# Patient Record
Sex: Female | Born: 1995 | Hispanic: Yes | Marital: Single | Smoking: Never smoker
Health system: Southern US, Community
[De-identification: ages and names within clinical notes are randomized; demographics above are authoritative.]

## PROBLEM LIST (undated history)

## (undated) DIAGNOSIS — N946 Dysmenorrhea, unspecified: Secondary | ICD-10-CM

## (undated) HISTORY — PX: WISDOM TOOTH EXTRACTION: SHX21

## (undated) HISTORY — DX: Dysmenorrhea, unspecified: N94.6

## (undated) HISTORY — PX: ABDOMINAL SURGERY: SHX537

---

## 2016-08-31 ENCOUNTER — Ambulatory Visit (INDEPENDENT_AMBULATORY_CARE_PROVIDER_SITE_OTHER): Payer: Medicaid Other | Admitting: Obstetrics and Gynecology

## 2016-08-31 ENCOUNTER — Encounter: Payer: Self-pay | Admitting: Obstetrics and Gynecology

## 2016-08-31 VITALS — BP 99/60 | HR 75 | Ht 60.0 in | Wt 116.3 lb

## 2016-08-31 DIAGNOSIS — Z113 Encounter for screening for infections with a predominantly sexual mode of transmission: Secondary | ICD-10-CM

## 2016-08-31 DIAGNOSIS — N939 Abnormal uterine and vaginal bleeding, unspecified: Secondary | ICD-10-CM

## 2016-08-31 DIAGNOSIS — N946 Dysmenorrhea, unspecified: Secondary | ICD-10-CM | POA: Diagnosis not present

## 2016-08-31 LAB — POCT URINE PREGNANCY: PREG TEST UR: NEGATIVE

## 2016-08-31 MED ORDER — NORETHIN ACE-ETH ESTRAD-FE 1-20 MG-MCG PO TABS: 1.0000 | ORAL_TABLET | Freq: Every day | ORAL | 6 refills | Status: DC

## 2016-08-31 NOTE — Patient Instructions (Signed)
1. Urinalysis for STD screening is obtained 2. Begin oral contraceptives 3. Maintain menstrual calendar monitoring 4. Return in 3 months for follow-up  Oral Contraception Information Oral contraceptive pills (OCPs) are medicines taken to prevent pregnancy. OCPs work by preventing the ovaries from releasing eggs. The hormones in OCPs also cause the cervical mucus to thicken, preventing the sperm from entering the uterus. The hormones also cause the uterine lining to become thin, not allowing a fertilized egg to attach to the inside of the uterus. OCPs are highly effective when taken exactly as prescribed. However, OCPs do not prevent sexually transmitted diseases (STDs). Safe sex practices, such as using condoms along with the pill, can help prevent STDs.  Before taking the pill, you may have a physical exam and Pap test. Your health care provider may order blood tests. The health care provider will make sure you are a good candidate for oral contraception. Discuss with your health care provider the possible side effects of the OCP you may be prescribed. When starting an OCP, it can take 2 to 3 months for the body to adjust to the changes in hormone levels in your body.  TYPES OF ORAL CONTRACEPTION  The combination pill--This pill contains estrogen and progestin (synthetic progesterone) hormones. The combination pill comes in 21-day, 28-day, or 91-day packs. Some types of combination pills are meant to be taken continuously (365-day pills). With 21-day packs, you do not take pills for 7 days after the last pill. With 28-day packs, the pill is taken every day. The last 7 pills are without hormones. Certain types of pills have more than 21 hormone-containing pills. With 91-day packs, the first 84 pills contain both hormones, and the last 7 pills contain no hormones or contain estrogen only.  The minipill--This pill contains the progesterone hormone only. The pill is taken every day continuously. It is very  important to take the pill at the same time each day. The minipill comes in packs of 28 pills. All 28 pills contain the hormone.  ADVANTAGES OF ORAL CONTRACEPTIVE PILLS  Decreases premenstrual symptoms.   Treats menstrual period cramps.   Regulates the menstrual cycle.   Decreases a heavy menstrual flow.   May treatacne, depending on the type of pill.   Treats abnormal uterine bleeding.   Treats polycystic ovarian syndrome.   Treats endometriosis.   Can be used as emergency contraception.  THINGS THAT CAN MAKE ORAL CONTRACEPTIVE PILLS LESS EFFECTIVE OCPs can be less effective if:   You forget to take the pill at the same time every day.   You have a stomach or intestinal disease that lessens the absorption of the pill.   You take OCPs with other medicines that make OCPs less effective, such as antibiotics, certain HIV medicines, and some seizure medicines.   You take expired OCPs.   You forget to restart the pill on day 7, when using the packs of 21 pills.  RISKS ASSOCIATED WITH ORAL CONTRACEPTIVE PILLS  Oral contraceptive pills can sometimes cause side effects, such as:  Headache.  Nausea.  Breast tenderness.  Irregular bleeding or spotting. Combination pills are also associated with a small increased risk of:  Blood clots.  Heart attack.  Stroke.   This information is not intended to replace advice given to you by your health care provider. Make sure you discuss any questions you have with your health care provider.   Document Released: 01/06/2003 Document Revised: 08/06/2013 Document Reviewed: 04/06/2013 Elsevier Interactive Patient Education 2016 Elsevier  Inc. Dysmenorrhea Menstrual cramps (dysmenorrhea) are caused by the muscles of the uterus tightening (contracting) during a menstrual period. For some women, this discomfort is merely bothersome. For others, dysmenorrhea can be severe enough to interfere with everyday activities for a few  days each month. Primary dysmenorrhea is menstrual cramps that last a couple of days when you start having menstrual periods or soon after. This often begins after a teenager starts having her period. As a woman gets older or has a baby, the cramps will usually lessen or disappear. Secondary dysmenorrhea begins later in life, lasts longer, and the pain may be stronger than primary dysmenorrhea. The pain may start before the period and last a few days after the period.  CAUSES  Dysmenorrhea is usually caused by an underlying problem, such as:  The tissue lining the uterus grows outside of the uterus in other areas of the body (endometriosis).  The endometrial tissue, which normally lines the uterus, is found in or grows into the muscular walls of the uterus (adenomyosis).  The pelvic blood vessels are engorged with blood just before the menstrual period (pelvic congestive syndrome).  Overgrowth of cells (polyps) in the lining of the uterus or cervix.  Falling down of the uterus (prolapse) because of loose or stretched ligaments.  Depression.  Bladder problems, infection, or inflammation.  Problems with the intestine, a tumor, or irritable bowel syndrome.  Cancer of the female organs or bladder.  A severely tipped uterus.  A very tight opening or closed cervix.  Noncancerous tumors of the uterus (fibroids).  Pelvic inflammatory disease (PID).  Pelvic scarring (adhesions) from a previous surgery.  Ovarian cyst.  An intrauterine device (IUD) used for birth control. RISK FACTORS You may be at greater risk of dysmenorrhea if:  You are younger than age 19.  You started puberty early.  You have irregular or heavy bleeding.  You have never given birth.  You have a family history of this problem.  You are a smoker. SIGNS AND SYMPTOMS   Cramping or throbbing pain in your lower abdomen.  Headaches.  Lower back pain.  Nausea or vomiting.  Diarrhea.  Sweating or  dizziness.  Loose stools. DIAGNOSIS  A diagnosis is based on your history, symptoms, physical exam, diagnostic tests, or procedures. Diagnostic tests or procedures may include:  Blood tests.  Ultrasonography.  An examination of the lining of the uterus (dilation and curettage, D&C).  An examination inside your abdomen or pelvis with a scope (laparoscopy).  X-rays.  CT scan.  MRI.  An examination inside the bladder with a scope (cystoscopy).  An examination inside the intestine or stomach with a scope (colonoscopy, gastroscopy). TREATMENT  Treatment depends on the cause of the dysmenorrhea. Treatment may include:  Pain medicine prescribed by your health care provider.  Birth control pills or an IUD with progesterone hormone in it.  Hormone replacement therapy.  Nonsteroidal anti-inflammatory drugs (NSAIDs). These may help stop the production of prostaglandins.  Surgery to remove adhesions, endometriosis, ovarian cyst, or fibroids.  Removal of the uterus (hysterectomy).  Progesterone shots to stop the menstrual period.  Cutting the nerves on the sacrum that go to the female organs (presacral neurectomy).  Electric current to the sacral nerves (sacral nerve stimulation).  Antidepressant medicine.  Psychiatric therapy, counseling, or group therapy.  Exercise and physical therapy.  Meditation and yoga therapy.  Acupuncture. HOME CARE INSTRUCTIONS   Only take over-the-counter or prescription medicines as directed by your health care provider.  Place a  heating pad or hot water bottle on your lower back or abdomen. Do not sleep with the heating pad.  Use aerobic exercises, walking, swimming, biking, and other exercises to help lessen the cramping.  Massage to the lower back or abdomen may help.  Stop smoking.  Avoid alcohol and caffeine. SEEK MEDICAL CARE IF:   Your pain does not get better with medicine.  You have pain with sexual intercourse.  Your  pain increases and is not controlled with medicines.  You have abnormal vaginal bleeding with your period.  You develop nausea or vomiting with your period that is not controlled with medicine. SEEK IMMEDIATE MEDICAL CARE IF:  You pass out.    This information is not intended to replace advice given to you by your health care provider. Make sure you discuss any questions you have with your health care provider.   Document Released: 10/16/2005 Document Revised: 06/18/2013 Document Reviewed: 04/03/2013 Elsevier Interactive Patient Education [employer]

## 2016-08-31 NOTE — Progress Notes (Signed)
GYN ENCOUNTER NOTE  Subjective:       Kaitlin Kennedy is a 20 y.o. G0P0000 female is here for gynecologic evaluation of the following issues:  1. Dysmenorrhea- Patient has irregular menstrual cycles and pelvic/back cramps with menstruation. Cycles last 5-6days occurring every 28-56 days. LMP was 07/15/16.  Menarche at age 89. Pt currently uses tylenol or advil for pain relief. Sexually active once in July,  used a condom for birth control. Denies vaginal discharge, odor, itching or irritation.     Gynecologic History Patient's last menstrual period was 07/05/2016 (exact date). Contraception: condoms   Obstetric History OB History  Gravida Para Term Preterm AB Living  0 0 0 0 0 0  SAB TAB Ectopic Multiple Live Births  0 0 0 0 0        Past Medical History:  Diagnosis Date  . Dysmenorrhea     Past Surgical History:  Procedure Laterality Date  . ABDOMINAL SURGERY     ?hernia, pt was 4y0  . WISDOM TOOTH EXTRACTION      No current outpatient prescriptions on file prior to visit.   No current facility-administered medications on file prior to visit.     No Known Allergies  Social History   Social History  . Marital status: Single    Spouse name: N/A  . Number of children: N/A  . Years of education: N/A   Occupational History  . Not on file.   Social History Main Topics  . Smoking status: Never Smoker  . Smokeless tobacco: Never Used  . Alcohol use No  . Drug use: No  . Sexual activity: Yes    Partners: Male    Birth control/ protection: Condom   Other Topics Concern  . Not on file   Social History Narrative  . No narrative on file    Family History  Problem Relation Age of Onset  . Cancer Maternal Grandmother   . Diabetes Paternal Grandmother     The following portions of the patient's history were reviewed and updated as appropriate: allergies, current medications, past family history, past medical history, past social history, past surgical history and  problem list.  Review of Systems Review of Systems - General ROS: negative for - chills, fatigue, fever, hot flashes, malaise or night sweats Hematological and Lymphatic ROS: negative for - bleeding problems or swollen lymph nodes Gastrointestinal ROS: negative for -  blood in stools, change in bowel habits and nausea/vomiting Musculoskeletal ROS: negative for - joint pain, muscle pain or muscular weakness Genito-Urinary ROS: negative for - dyspareunia, dysuria, genital discharge, genital ulcers, hematuria, incontinence,  nocturia or pelvic pain  Objective:   BP 99/60   Pulse 75   Ht 5' (1.524 m)   Wt 116 lb 4.8 oz (52.8 kg)   LMP 07/05/2016 (Exact Date)   BMI 22.71 kg/m   Physical Exam: deferred    Assessment:   1. Dysmenorrhea 2. Irregular menstrual cycles 3. Sexually active   Patient has irregular periods with dysmenorrhea, managed with Advil or Tylenol. Presentation does not appear severe and no known family history of endometriosis. Since patient is also sexually active birth control pills is best option to regulate her cycles and provide contraception. Will screen for GC/CT and pregnancy.   Plan:   1. Urine GC/CT screen 2. POCT urine pregnancy test 3. Educated on importance of using condoms for protection against STDs regardless of OCPs  4. Prescribed Junel Fe 1/20 PO daily 5. Maintain menstrual calendar monitoring  A total of 30 minutes were spent face-to-face with the patient during the encounter with greater than 50% dealing with counseling and coordination of care.   RTC in 3 months for follow-up  Corena HerterAnna Parr, PA-S Herold HarmsMartin A Kathren Scearce, MD   I have seen, interviewed, and examined the patient in conjunction with the [employer] [facility] and affirm the diagnosis and management plan. Josey Forcier A. Izac Faulkenberry, MD, FACOG   Note: This dictation was prepared with Dragon dictation along with smaller phrase technology. Any transcriptional errors that result from  this process are unintentional.

## 2016-09-05 LAB — GC/CHLAMYDIA PROBE AMP
CHLAMYDIA, DNA PROBE: NEGATIVE
NEISSERIA GONORRHOEAE BY PCR: NEGATIVE

## 2016-11-30 ENCOUNTER — Ambulatory Visit (INDEPENDENT_AMBULATORY_CARE_PROVIDER_SITE_OTHER): Payer: Medicaid Other | Admitting: Obstetrics and Gynecology

## 2016-11-30 ENCOUNTER — Encounter: Payer: Self-pay | Admitting: Obstetrics and Gynecology

## 2016-11-30 VITALS — BP 91/59 | HR 76 | Ht 60.0 in | Wt 119.3 lb

## 2016-11-30 DIAGNOSIS — N946 Dysmenorrhea, unspecified: Secondary | ICD-10-CM

## 2016-11-30 DIAGNOSIS — Z3041 Encounter for surveillance of contraceptive pills: Secondary | ICD-10-CM

## 2016-11-30 DIAGNOSIS — N939 Abnormal uterine and vaginal bleeding, unspecified: Secondary | ICD-10-CM

## 2016-11-30 NOTE — Progress Notes (Signed)
GYN  ENCOUNTER NOTE  Subjective:       Kaitlin Kennedy is a 21 y.o. G0P0000 female here for a routine annual gynecologic exam.  Current complaints: 1. Irregular cycle f/u- prescribed Junel Fe 1/20 PO daily.   2. Dysmenorrhea sx    Pt reports improving cramping and back pain with Junel for November 2017. Did not refill Junel in December d/t insurance problem. Refilled in January 2018 -no cramping or pain. No need for NSAID. Occasional but improving breast tenderness. Denies Junel SE other than minimal weight gain. Some spotting likely d/t break in pills during month of December as pt. Reports otherwise takes pill at same time each day. Pt. Did not maintain a menstrual calendar but plans to do so in upcoming months.  Uncertain of Guardasil vaccination status - will check with pediatrician. Pt. sexually active with 1 partner and using condoms. STD test on 08/31/16 negative.   Gynecologic History Patient's last menstrual period was 11/22/2015 (exact date). Contraception: condoms & OCP  Obstetric History OB History  Gravida Para Term Preterm AB Living  0 0 0 0 0 0  SAB TAB Ectopic Multiple Live Births  0 0 0 0 0        Past Medical History:  Diagnosis Date  . Dysmenorrhea     Past Surgical History:  Procedure Laterality Date  . ABDOMINAL SURGERY     ?hernia, pt was 4y0  . WISDOM TOOTH EXTRACTION      Current Outpatient Prescriptions on File Prior to Visit  Medication Sig Dispense Refill  . norethindrone-ethinyl estradiol (JUNEL FE 1/20) 1-20 MG-MCG tablet Take 1 tablet by mouth daily. 1 Package 6   No current facility-administered medications on file prior to visit.     No Known Allergies  Social History   Social History  . Marital status: Single    Spouse name: N/A  . Number of children: N/A  . Years of education: N/A   Occupational History  . Not on file.   Social History Main Topics  . Smoking status: Never Smoker  . Smokeless tobacco: Never Used  . Alcohol use  No  . Drug use: No  . Sexual activity: Yes    Partners: Male    Birth control/ protection: Condom, Pill   Other Topics Concern  . Not on file   Social History Narrative  . No narrative on file    Family History  Problem Relation Age of Onset  . Cancer Maternal Grandmother   . Diabetes Paternal Grandmother     The following portions of the patient's history were reviewed and updated as appropriate: allergies, current medications, past family history, past medical history, past social history, past surgical history and problem list.  Review of Systems ROS Negative other than those listed in HPI.   Objective:   BP (!) 91/59   Pulse 76   Ht 5' (1.524 m)   Wt 119 lb 4.8 oz (54.1 kg)   LMP 11/22/2015 (Exact Date)   BMI 23.30 kg/m  CONSTITUTIONAL: Well-developed, well-nourished female in no acute distress.  PSYCHIATRIC: Normal mood and affect. Normal behavior. Normal judgment and thought content. NEUROLGIC: Alert and oriented to person, place, and time. Normal muscle tone coordination. No cranial nerve deficit noted. HENT:  Normocephalic, atraumatic, External right and left ear normal. Oropharynx is clear and moist EYES: Conjunctivae and EOM are normal. Pupils are equal, round, and reactive to light. No scleral icterus.  NECK: Normal range of motion, supple, no masses.  Normal  thyroid.  SKIN: Skin is warm and dry. No rash noted. Not diaphoretic. No erythema. No pallor. CARDIOVASCULAR: Normal heart rate noted, regular rhythm, no murmur. RESPIRATORY: Clear to auscultation bilaterally. Effort and breath sounds normal, no problems with respiration noted. BREASTS: Not examined. ABDOMEN: Soft, normal bowel sounds, no distention noted.  No tenderness, rebound or guarding.  BLADDER: Normal PELVIC: Not examined   External Genitalia: Not examined   BUS: Not examined   Vagina: Not examined   Cervix: Not examined   Uterus: Not examined   Adnexa: Not examined   RV: Not examined   MUSCULOSKELETAL: Normal range of motion. No tenderness.  No cyanosis, clubbing, or edema.  2+ distal pulses. LYMPHATIC: No Axillary, Supraclavicular, or Inguinal Adenopathy.    Assessment:  1. Irregular cycle - Undetermined improvement d/t need for 3 consecutive months of Junel & also did not document / record cycle patterns to present at this visit. 2. Dysmenorrhea sx  - improving with Junel. 3. Contraception:Junel 4.  Normal BMI Problem List Items Addressed This Visit    None      Plan:  1. Return to clinic in 3 months for follow up then 1 year for annual exam. 2. Encouraged to track Menstrual cycles in upcoming 3 months.   3.  Emphasized condom use and safe sex practices. 4.  Patient is to consider Gardasil vaccine; she will check with pediatrician to See if she has received this vaccine  A total of 15 minutes were spent face-to-face with the patient during this encounter and over half of that time dealt with counseling and coordination of care.  Marisue IvanJacquelyn Visser, Student-PA  Herold HarmsMartin A Manasvi Dickard, MD   I have seen, interviewed, and examined the patient in conjunction with the [facility] [facility] and affirm the diagnosis and management plan. Anginette Espejo A. Gwenna Fuston, MD, FACOG   Note: This dictation was prepared with Dragon dictation along with smaller phrase technology. Any transcriptional errors that result from this process are unintentional.

## 2016-11-30 NOTE — Patient Instructions (Signed)
1. Continue taking oral contraceptives as prescribed 2. Maintain menstrual calendar monitoring for bleeding 3. Return in 3 months for follow-up 4. Recommend continued use of condoms for prevention of STDs 5. Consider Gardasil vaccine; review information below and contact pediatrician to see if you have received vaccine   Preventing Cervical Cancer Cervical cancer is cancer that grows on the cervix. The cervix is at the bottom of the uterus. It connects the uterus to the vagina. The uterus is where a baby develops during pregnancy. Cancer occurs when cells become abnormal and start to grow out of control. Cervical cancer grows slowly and may not cause any symptoms at first. Over time, the cancer can grow deep into the cervix tissue and spread to other areas. If it is found early, cervical cancer can be treated effectively. You can also take steps to prevent this type of cancer. Most cases of cervical cancer are caused by an STI (sexually transmitted infection) called human papillomavirus (HPV). One way to reduce your risk of cervical cancer is to avoid infection with the HPV virus. You can do this by practicing safe sex and by getting the HPV vaccine. Getting regular Pap tests is also important because this can help identify changes in cells that could lead to cancer. Your chances of getting this disease can also be reduced by making certain lifestyle changes. How can I protect myself from cervical cancer? Preventing HPV infection  Ask your health care provider about getting the HPV vaccine. If you are 20 years old or younger, you may need to get this vaccine, which is given in three doses over 6 months. This vaccine protects against the types of HPV that could cause cancer.  Limit the number of people you have sex with. Also avoid having sex with people who have had many sex partners.  Use a latex condom during sex. Getting Pap tests  Get Pap tests regularly, starting at age 20. Talk with your  health care provider about how often you need these tests.  Most women who are 5421?20 years of age should have a Pap test every 3 years.  Most women who are 4930?20 years of age should have a Pap test in combination with an HPV test every 5 years.  Women with a higher risk of cervical cancer, such as those with a weakened immune system or those who have been exposed to the drug diethylstilbestrol (DES), may need more frequent testing. Making other lifestyle changes  Do not use any products that contain nicotine or tobacco, such as cigarettes and e-cigarettes. If you need help quitting, ask your health care provider.  Eat at least 5 servings of fruits and vegetables every day.  Lose weight if you are overweight. Why are these changes important?  These changes and screening tests are designed to address the factors that are known to increase the risk of cervical cancer. Taking these steps is the best way to reduce your risk.  Having regular Pap tests will help identify changes in cells that could lead to cancer. Steps can then be taken to prevent cancer from developing.  These changes will also help find cervical cancer early. This type of cancer can be treated effectively if it is found early. It can be more dangerous and difficult to treat if cancer has grown deep into your cervix or has spread.  In addition to making you less likely to get cervical cancer, these changes will also provide other health benefits, such as the following:  Practicing safe sex is important for preventing STIs and unplanned pregnancies.  Avoiding tobacco can reduce your risk for other cancers and health issues.  Eating a healthy diet and maintaining a healthy weight are good for your overall health. What can happen if changes are not made? In the early stages, cervical cancer might not have any symptoms. It can take many years for the cancer to grow and get deep into the cervix tissue. This may be happening  without you knowing about it. If you develop any symptoms, such as pelvic pain or unusual discharge or bleeding from your vagina, you should see your health care provider right away. If cervical cancer is not found early, you might need treatments such as radiation, chemotherapy, or surgery. In some cases, surgery may mean that you will not be able to get pregnant or carry a pregnancy to term. Where to find support: Talk with your health care provider, school nurse, or local health department for guidance about screening and vaccination. Some children and teens may be able to get the HPV vaccine free of charge through the U.[place] government's Vaccines for Children [facility]) program. Other places that provide vaccinations include:  Public health clinics. Check with your local health department.  Federally [employer], where you would pay only what you can afford. To find one near you, check this website: http://weiss.info/  [facility]. These are part of a program for Medicare and Medicaid patients who live in rural areas. The National Breast and Cervical Cancer Early Detection Program also provides breast and cervical cancer screenings and diagnostic services to low-income, uninsured, and underinsured women. Cervical cancer can be passed down through families. Talk with your health care provider or genetic counselor to learn more about genetic testing for cancer. Where to find more information: Learn more about cervical cancer from:  [employer] Gyne[employer]: HardChecks.be  American Cancer Society: www.cancer.org/cancer/cervicalcancer/  [place]. Centers for Disease Control and Prevention: MedicationWarning.tn Summary  Talk with your health care provider about getting the HPV vaccine.  Be sure to get regular Pap tests as recommended by your health care provider.  See your health care provider right away if you have any pelvic  pain or unusual discharge or bleeding from your vagina. This information is not intended to replace advice given to you by your health care provider. Make sure you discuss any questions you have with your health care provider. Document Released: 10/31/2015 Document Revised: 06/13/2016 Document Reviewed: 06/13/2016 Elsevier Interactive Patient Education  2017 [employer].

## 2017-02-27 ENCOUNTER — Encounter: Payer: Medicaid Other | Admitting: Obstetrics and Gynecology

## 2017-04-14 ENCOUNTER — Other Ambulatory Visit: Payer: Self-pay | Admitting: Obstetrics and Gynecology

## 2018-12-19 ENCOUNTER — Other Ambulatory Visit (HOSPITAL_COMMUNITY)
Admission: RE | Admit: 2018-12-19 | Discharge: 2018-12-19 | Disposition: A | Discharge status: Home / Self Care | Payer: Self-pay | Source: Ambulatory Visit | Attending: Certified Nurse Midwife | Admitting: Certified Nurse Midwife

## 2018-12-19 ENCOUNTER — Encounter: Payer: Self-pay | Admitting: Certified Nurse Midwife

## 2018-12-19 ENCOUNTER — Ambulatory Visit (INDEPENDENT_AMBULATORY_CARE_PROVIDER_SITE_OTHER): Payer: Medicaid Other | Admitting: Certified Nurse Midwife

## 2018-12-19 VITALS — BP 107/69 | HR 77 | Ht 60.0 in | Wt 143.5 lb

## 2018-12-19 DIAGNOSIS — N941 Unspecified dyspareunia: Secondary | ICD-10-CM

## 2018-12-19 DIAGNOSIS — Z0000 Encounter for general adult medical examination without abnormal findings: Secondary | ICD-10-CM

## 2018-12-19 DIAGNOSIS — Z124 Encounter for screening for malignant neoplasm of cervix: Secondary | ICD-10-CM | POA: Insufficient documentation

## 2018-12-19 DIAGNOSIS — Z113 Encounter for screening for infections with a predominantly sexual mode of transmission: Secondary | ICD-10-CM | POA: Insufficient documentation

## 2018-12-19 DIAGNOSIS — Z23 Encounter for immunization: Secondary | ICD-10-CM

## 2018-12-19 DIAGNOSIS — Z01419 Encounter for gynecological examination (general) (routine) without abnormal findings: Secondary | ICD-10-CM | POA: Insufficient documentation

## 2018-12-19 NOTE — Patient Instructions (Addendum)
WE WOULD LOVE TO HEAR FROM YOU!!!!   Thank you Kaitlin Kennedy for visiting [facility].  Providing our patients with the best experience possible is really important to [place], and we hope that you felt that on your recent visit. The most valuable feedback we get comes from Kaitlin Kennedy!!    If you receive a survey please take a couple of minutes to let us know how we did.Thank you for continuing to trust [place] with your care.   [facility]   Safe Sex Practicing safe sex means taking steps before and during sex to reduce your risk of:  Getting an STD (sexually transmitted disease).  Giving your partner an STD.  Unwanted pregnancy. How can I practice safe sex? To practice safe sex:  Limit your sexual partners to only one partner who is having sex with only you.  Avoid using alcohol and recreational drugs before having sex. These substances can affect your judgment.  Before having sex with a new partner: ? Talk to your partner about past partners, past STDs, and drug use. ? You and your partner should be screened for STDs and discuss the results with each other.  Check your body regularly for sores, blisters, rashes, or unusual discharge. If you notice any of these problems, visit your health care provider.  If you have symptoms of an infection or you are being treated for an STD, avoid sexual contact.  While having sex, use a condom. Make sure to: ? Use a condom every time you have vaginal, oral, or anal sex. Both females and males should wear condoms during oral sex. ? Keep condoms in place from the beginning to the end of sexual activity. ? Use a latex condom, if possible. Latex condoms offer the best protection. ? Use only water-based lubricants or oils to lubricate a condom. Using petroleum-based lubricants or oils will weaken the condom and increase the chance that it will break.  See your health care provider for regular screenings, exams, and tests  for STDs.  Talk with your health care provider about the form of birth control (contraception) that is best for you.  Get vaccinated against hepatitis B and human papillomavirus (HPV).  If you are at risk of being infected with HIV (human immunodeficiency virus), talk with your health care provider about taking a prescription medicine to prevent HIV infection. You are considered at risk for HIV if: ? You are a man who has sex with other men. ? You are a heterosexual man or woman who is sexually active with more than one partner. ? You take drugs by injection. ? You are sexually active with a partner who has HIV. This information is not intended to replace advice given to you by your health care provider. Make sure you discuss any questions you have with your health care provider. Document Released: 11/23/2004 Document Revised: 03/01/2016 Document Reviewed: 09/05/2015 Elsevier Interactive Patient Education  2019 [employer] 18-39 Years, Female Preventive care refers to lifestyle choices and visits with your health care provider that can promote health and wellness. What does preventive care include?   A yearly physical exam. This is also called an annual well check.  Dental exams once or twice a year.  Routine eye exams. Ask your health care provider how often you should have your eyes checked.  Personal lifestyle choices, including: ? Daily care of your teeth and gums. ? Regular physical activity. ? Eating a healthy diet. ? Avoiding tobacco and drug  use. ? Limiting alcohol use. ? Practicing safe sex. ? Taking vitamin and mineral supplements as recommended by your health care provider. What happens during an annual well check? The services and screenings done by your health care provider during your annual well check will depend on your age, overall health, lifestyle risk factors, and family history of disease. Counseling Your health care provider may ask you  questions about your:  Alcohol use.  Tobacco use.  Drug use.  Emotional well-being.  Home and relationship well-being.  Sexual activity.  Eating habits.  Work and work Statistician.  Method of birth control.  Menstrual cycle.  Pregnancy history. Screening You may have the following tests or measurements:  Height, weight, and BMI.  Diabetes screening. This is done by checking your blood sugar (glucose) after you have not eaten for a while (fasting).  Blood pressure.  Lipid and cholesterol levels. These may be checked every 5 years starting at age 49.  Skin check.  Hepatitis C blood test.  Hepatitis B blood test.  Sexually transmitted disease (STD) testing.  BRCA-related cancer screening. This may be done if you have a family history of breast, ovarian, tubal, or peritoneal cancers.  Pelvic exam and Pap test. This may be done every 3 years starting at age 36. Starting at age 55, this may be done every 5 years if you have a Pap test in combination with an HPV test. Discuss your test results, treatment options, and if necessary, the need for more tests with your health care provider. Vaccines Your health care provider may recommend certain vaccines, such as:  Influenza vaccine. This is recommended every year.  Tetanus, diphtheria, and acellular pertussis (Tdap, Td) vaccine. You may need a Td booster every 10 years.  Varicella vaccine. You may need this if you have not been vaccinated.  HPV vaccine. If you are 32 or younger, you may need three doses over 6 months.  Measles, mumps, and rubella (MMR) vaccine. You may need at least one dose of MMR. You may also need a second dose.  Pneumococcal 13-valent conjugate (PCV13) vaccine. You may need this if you have certain conditions and were not previously vaccinated.  Pneumococcal polysaccharide (PPSV23) vaccine. You may need one or two doses if you smoke cigarettes or if you have certain conditions.  Meningococcal  vaccine. One dose is recommended if you are age 88-21 years and a first-year college student living in a residence hall, or if you have one of several medical conditions. You may also need additional booster doses.  Hepatitis A vaccine. You may need this if you have certain conditions or if you travel or work in places where you may be exposed to hepatitis A.  Hepatitis B vaccine. You may need this if you have certain conditions or if you travel or work in places where you may be exposed to hepatitis B.  Haemophilus influenzae type b (Hib) vaccine. You may need this if you have certain risk factors. Talk to your health care provider about which screenings and vaccines you need and how often you need them. This information is not intended to replace advice given to you by your health care provider. Make sure you discuss any questions you have with your health care provider. Document Released: 12/12/2001 Document Revised: 05/29/2017 Document Reviewed: 08/17/2015 Elsevier Interactive Patient Education  2019 [employer].

## 2018-12-19 NOTE — Progress Notes (Signed)
ANNUAL PREVENTATIVE CARE GYN  ENCOUNTER NOTE  Subjective:       Kaitlin Kennedy is a 23 y.o. G0P0000 female here for a routine annual gynecologic exam.  Current complaints: 1.  Dyspareunia  Reports dyspareunia with last sexual encounter (12/17/2018). Reports feeling of dryness. Denies use of lubricant. Denies dysuria. Endorses pink tinged discharge and pain when wiping. Denies vaginal odor or increased discharge. Reports new body wash, but no other areas of irritation noted.   Denies shortness of breath, respiratory distress, abdominal pain, excessive vaginal bleeding, dysuria, leg cramping or swelling.     Gynecologic History  Patient's last menstrual period was 11/19/2018 (exact date).   Period Cycle (Days): 28 Period Duration (Days): 5 Period Pattern: Regular Menstrual Flow: Moderate, Heavy Menstrual Control: Maxi pad, Thin pad Dysmenorrhea: (!) Moderate Dysmenorrhea Symptoms: Cramping  Contraception: condoms and coitus interruptus   Last Pap: Never.   Obstetric History  OB History  Gravida Para Term Preterm AB Living  0 0 0 0 0 0  SAB TAB Ectopic Multiple Live Births  0 0 0 0 0    Past Medical History:  Diagnosis Date  . Dysmenorrhea     Past Surgical History:  Procedure Laterality Date  . ABDOMINAL SURGERY     ?hernia, pt was 3y0  . WISDOM TOOTH EXTRACTION      No current outpatient medications on file prior to visit.   No current facility-administered medications on file prior to visit.     No Known Allergies  Social History   Socioeconomic History  . Marital status: Single    Spouse name: Not on file  . Number of children: Not on file  . Years of education: Not on file  . Highest education level: Not on file  Occupational History  . Not on file  Social Needs  . Financial resource strain: Not on file  . Food insecurity:    Worry: Not on file    Inability: Not on file  . Transportation needs:    Medical: Not on file    Non-medical: Not on file   Tobacco Use  . Smoking status: Never Smoker  . Smokeless tobacco: Never Used  Substance and Sexual Activity  . Alcohol use: No  . Drug use: No  . Sexual activity: Yes    Partners: Male    Birth control/protection: None  Lifestyle  . Physical activity:    Days per week: Not on file    Minutes per session: Not on file  . Stress: Not on file  Relationships  . Social connections:    Talks on phone: Not on file    Gets together: Not on file    Attends religious service: Not on file    Active member of club or organization: Not on file    Attends meetings of clubs or organizations: Not on file    Relationship status: Not on file  . Intimate partner violence:    Fear of current or ex partner: Not on file    Emotionally abused: Not on file    Physically abused: Not on file    Forced sexual activity: Not on file  Other Topics Concern  . Not on file  Social History Narrative  . Not on file    Family History  Problem Relation Age of Onset  . Cancer Maternal Grandmother   . Diabetes Paternal Grandmother   . Diabetes Father     The following portions of the patient's history were reviewed and updated  as appropriate: allergies, current medications, past family history, past medical history, past social history, past surgical history and problem list.  Review of Systems  ROS negative except for the information above. Information obtained from the patient.   Objective:   BP 107/69   Pulse 77   Ht 5' (1.524 m)   Wt 143 lb 8 oz (65.1 kg)   LMP 11/19/2018 (Exact Date)   BMI 28.03 kg/m    CONSTITUTIONAL: Well-developed, well-nourished female in no acute distress.   PSYCHIATRIC: Normal mood and affect. Normal behavior. Normal judgment and thought content.  NEUROLGIC: Alert and oriented to person, place, and time. Normal muscle tone coordination. No cranial nerve deficit noted.  HENT:  Normocephalic, atraumatic, External right and left ear normal. Oropharynx is clear and  moist  EYES: Conjunctivae and EOM are normal. Pupils are equal, round, and reactive to light. No scleral icterus.   NECK: Normal range of motion, supple, no masses.  Normal thyroid.   SKIN: Skin is warm and dry. No rash noted. Not diaphoretic. No erythema. No pallor.  CARDIOVASCULAR: Normal heart rate noted, regular rhythm, no murmur.  RESPIRATORY: Clear to auscultation bilaterally. Effort and breath sounds normal, no problems with respiration noted.  BREASTS: Symmetric in size. No masses, skin changes, nipple drainage, or lymphadenopathy.  ABDOMEN: Soft, normal bowel sounds, no distention noted.    PELVIC:  External Genitalia: Normal  Vagina: Normal  Cervix: Friable.  Thick yellow discharge noted.    MUSCULOSKELETAL: Normal range of motion. No tenderness.  No cyanosis, clubbing, or edema.  2+ distal pulses.  LYMPHATIC: No Axillary, Supraclavicular, or Inguinal Adenopathy.  Assessment:   Annual gynecologic examination 23 y.o.   Contraception: coitus interruptus and condoms   Overweight   Problem List Items Addressed This Visit    None    Visit Diagnoses    Well woman exam    -  Primary   Relevant Orders   Cytology - PAP   Screening for cervical cancer       Relevant Orders   Cytology - PAP   Routine screening for STI (sexually transmitted infection)       Relevant Orders   Cytology - PAP   Dyspareunia, female       Relevant Orders   Cytology - PAP      Plan:   OTC lubricant recommended for dyspareunia.   Pap: Pap, Reflex if ASCUS and GC/CT NAAT  Labs: Patient deferred.  Routine preventative health maintenance measures emphasized: Exercise/Diet/Weight control, Tobacco Warnings, Alcohol/Substance use risks, Stress Management, Peer Pressure Issues and Safe Sex  Reviewed red flag symptoms and when to call.   RTC x 1 year for ANNUAL or sooner if needed.   Darcella Cheshire, RN

## 2018-12-23 LAB — CYTOLOGY - PAP
Bacterial vaginitis: NEGATIVE
Candida vaginitis: POSITIVE — AB
Chlamydia: NEGATIVE
DIAGNOSIS: NEGATIVE
Neisseria Gonorrhea: NEGATIVE
TRICH (WINDOWPATH): NEGATIVE

## 2019-01-09 ENCOUNTER — Other Ambulatory Visit: Payer: Self-pay

## 2019-01-09 ENCOUNTER — Ambulatory Visit (INDEPENDENT_AMBULATORY_CARE_PROVIDER_SITE_OTHER): Payer: Self-pay | Admitting: Certified Nurse Midwife

## 2019-01-09 VITALS — BP 113/64 | HR 78 | Ht 60.0 in | Wt 144.2 lb

## 2019-01-09 DIAGNOSIS — N912 Amenorrhea, unspecified: Secondary | ICD-10-CM

## 2019-01-09 NOTE — Patient Instructions (Addendum)
WE WOULD LOVE TO HEAR FROM YOU!!!!   Thank you Kaitlin Kennedy for visiting [facility].  Providing our patients with the best experience possible is really important to [place], and we hope that you felt that on your recent visit. The most valuable feedback we get comes from YOU!!    If you receive a survey please take a couple of minutes to let us know how we did.Thank you for continuing to trust [place] with your care.   [facility]    Contraception Choices Contraception, also called birth control, means things to use or ways to try not to get pregnant. Hormonal birth control This kind of birth control uses hormones. Here are some types of hormonal birth control:  A tube that is put under skin of the arm (implant). The tube can stay in for as long as 3 years.  Shots to get every 3 months (injections).  Pills to take every day (birth control pills).  A patch to change 1 time each week for 3 weeks (birth control patch). After that, the patch is taken off for 1 week.  A ring to put in the vagina. The ring is left in for 3 weeks. Then it is taken out of the vagina for 1 week. Then a new ring is put in.  Pills to take after unprotected sex (emergency birth control pills). Barrier birth control Here are some types of barrier birth control:  A thin covering that is put on the penis before sex (female condom). The covering is thrown away after sex.  A soft, loose covering that is put in the vagina before sex (female condom). The covering is thrown away after sex.  A rubber bowl that sits over the cervix (diaphragm). The bowl must be made for you. The bowl is put into the vagina before sex. The bowl is left in for 6-8 hours after sex. It is taken out within 24 hours.  A small, soft cup that fits over the cervix (cervical cap). The cup must be made for you. The cup can be left in for 6-8 hours after sex. It is taken out within 48 hours.  A sponge that is put  into the vagina before sex. It must be left in for at least 6 hours after sex. It must be taken out within 30 hours. Then it is thrown away.  A chemical that kills or stops sperm from getting into the uterus (spermicide). It may be a pill, cream, jelly, or foam to put in the vagina. The chemical should be used at least 10-15 minutes before sex. IUD (intrauterine) birth control An IUD is a small, T-shaped piece of plastic. It is put inside the uterus. There are two kinds:  Hormone IUD. This kind can stay in for 3-5 years.  Copper IUD. This kind can stay in for 10 years. Permanent birth control Here are some types of permanent birth control:  Surgery to block the fallopian tubes.  Having an insert put into each fallopian tube.  Surgery to tie off the tubes that carry sperm (vasectomy). Natural planning birth control Here are some types of natural planning birth control:  Not having sex on the days the woman could get pregnant.  Using a calendar: ? To keep track of the length of each period. ? To find out what days pregnancy can happen. ? To plan to not have sex on days when pregnancy can happen.  Watching for symptoms of ovulation and not having  sex during ovulation. One way the woman can check for ovulation is to check her temperature.  Waiting to have sex until after ovulation. Summary  Contraception, also called birth control, means things to use or ways to try not to get pregnant.  Hormonal methods of birth control include implants, injections, pills, patches, vaginal rings, and emergency birth control pills.  Barrier methods of birth control can include female condoms, female condoms, diaphragms, cervical caps, sponges, and spermicides.  There are two types of IUD (intrauterine device) birth control. An IUD can be put in a woman's uterus to prevent pregnancy for 3-5 years.  Permanent sterilization can be done through a procedure for males, females, or both.  Natural  planning methods involve not having sex on the days when the woman could get pregnant. This information is not intended to replace advice given to you by your health care provider. Make sure you discuss any questions you have with your health care provider. Document Released: 08/13/2009 Document Revised: 05/23/2018 Document Reviewed: 10/26/2016 Elsevier Interactive Patient Education  2019 [employer].   Preparing for Pregnancy If you are considering becoming pregnant, make an appointment to see your regular health care provider to learn how to prepare for a safe and healthy pregnancy (preconception care). During a preconception care visit, your health care provider will:  Do a complete physical exam, including a Pap test.  Take a complete medical history.  Give you information, answer your questions, and help you resolve problems. Preconception checklist Medical history  Tell your health care provider about any current or past medical conditions. Your pregnancy or your ability to become pregnant may be affected by chronic conditions, such as diabetes, chronic hypertension, and thyroid problems.  Include your family's medical history as well as your partner's medical history.  Tell your health care provider about any history of STIs (sexually transmitted infections).These can affect your pregnancy. In some cases, they can be passed to your baby. Discuss any concerns that you have about STIs.  If indicated, discuss the benefits of genetic testing. This testing will show whether there are any genetic conditions that may be passed from you or your partner to your baby.  Tell your health care provider about: ? Any problems you have had with conception or pregnancy. ? Any medicines you take. These include vitamins, herbal supplements, and over-the-counter medicines. ? Your history of immunizations. Discuss any vaccinations that you may need. Diet  Ask your health care provider what to  include in a healthy diet that has a balance of nutrients. This is especially important when you are pregnant or preparing to become pregnant.  Ask your health care provider to help you reach a healthy weight before pregnancy. ? If you are overweight, you may be at higher risk for certain complications, such as high blood pressure, diabetes, and preterm birth. ? If you are underweight, you are more likely to have a baby who has a low birth weight. Lifestyle, work, and home  Let your health care provider know: ? About any lifestyle habits that you have, such as alcohol use, drug use, or smoking. ? About recreational activities that may put you at risk during pregnancy, such as downhill skiing and certain exercise programs. ? Tell your health care provider about any international travel, especially any travel to places with an active [place] virus outbreak. ? About harmful substances that you may be exposed to at work or at home. These include chemicals, pesticides, radiation, or even litter boxes. ?  If you do not feel safe at home. Mental health  Tell your health care provider about: ? Any history of mental health conditions, including feelings of depression, sadness, or anxiety. ? Any medicines that you take for a mental health condition. These include herbs and supplements. Home instructions to prepare for pregnancy Lifestyle   Eat a balanced diet. This includes fresh fruits and vegetables, whole grains, lean meats, low-fat dairy products, healthy fats, and foods that are high in fiber. Ask to meet with a nutritionist or registered dietitian for assistance with meal planning and goals.  Get regular exercise. Try to be active for at least 30 minutes a day on most days of the week. Ask your health care provider which activities are safe during pregnancy.  Do not use any products that contain nicotine or tobacco, such as cigarettes and e-cigarettes. If you need help quitting, ask your health  care provider.  Do not drink alcohol.  Do not take illegal drugs.  Maintain a healthy weight. Ask your health care provider what weight range is right for you. General instructions  Keep an accurate record of your menstrual periods. This makes it easier for your health care provider to determine your baby's due date.  Begin taking prenatal vitamins and folic acid supplements daily as directed by your health care provider.  Manage any chronic conditions, such as high blood pressure and diabetes, as told by your health care provider. This is important. How do I know that I am pregnant? You may be pregnant if you have been sexually active and you miss your period. Symptoms of early pregnancy include:  Mild cramping.  Very light vaginal bleeding (spotting).  Feeling unusually tired.  Nausea and vomiting (morning sickness). If you have any of these symptoms and you suspect that you might be pregnant, you can take a home pregnancy test. These tests check for a hormone in your urine (human chorionic gonadotropin, or hCG). A woman's body begins to make this hormone during early pregnancy. These tests are very accurate. Wait until at least the first day after you miss your period to take one. If the test shows that you are pregnant (you get a positive result), call your health care provider to make an appointment for prenatal care. What should I do if I become pregnant?      Make an appointment with your health care provider as soon as you suspect you are pregnant.  Do not use any products that contain nicotine, such as cigarettes, chewing tobacco, and e-cigarettes. If you need help quitting, ask your health care provider.  Do not drink alcoholic beverages. Alcohol is related to a number of birth defects.  Avoid toxic odors and chemicals.  You may continue to have sexual intercourse if it does not cause pain or other problems, such as vaginal bleeding. This information is not intended  to replace advice given to you by your health care provider. Make sure you discuss any questions you have with your health care provider. Document Released: 09/28/2008 Document Revised: 10/18/2017 Document Reviewed: 05/07/2016 Elsevier Interactive Patient Education  2019 [employer].

## 2019-01-10 ENCOUNTER — Encounter: Payer: Self-pay | Admitting: Certified Nurse Midwife

## 2019-01-10 LAB — BETA HCG QUANT (REF LAB): hCG Quant: <1 m[IU]/mL

## 2019-01-13 NOTE — Progress Notes (Signed)
GYN ENCOUNTER NOTE  Subjective:       Kaitlin Kennedy is a 23 y.o. G0P0000 female is here for gynecologic evaluation of the following issues:  1. Amenorrhea  Notes last full period was in January. Started spotting-pink, bloody mucus-for two (2) to three (3) days on 01/06/2019. This is unusual for patient.   Last sexual intercourse on Sunday. Home urine pregnancy test was negative.   Denies difficulty breathing or respiratory distress, chest pain, abdominal pain, excessive vaginal bleeding, dysuria, and leg pain or swelling.    Gynecologic History  Patient's last menstrual period was 11/15/2018.  Contraception: none  Last Pap: 11/2018. Results were: normal  Obstetric History  OB History  Gravida Para Term Preterm AB Living  0 0 0 0 0 0  SAB TAB Ectopic Multiple Live Births  0 0 0 0 0    Past Medical History:  Diagnosis Date  . Dysmenorrhea     Past Surgical History:  Procedure Laterality Date  . ABDOMINAL SURGERY     ?hernia, pt was 3y0  . WISDOM TOOTH EXTRACTION      No current outpatient medications on file prior to visit.   No current facility-administered medications on file prior to visit.     No Known Allergies  Social History   Socioeconomic History  . Marital status: Single    Spouse name: Not on file  . Number of children: Not on file  . Years of education: Not on file  . Highest education level: Not on file  Occupational History  . Occupation: Consulting civil engineer     Comment: [employer]   . Occupation: [employer]  Social Needs  . Financial resource strain: Not on file  . Food insecurity:    Worry: Not on file    Inability: Not on file  . Transportation needs:    Medical: Not on file    Non-medical: Not on file  Tobacco Use  . Smoking status: Never Smoker  . Smokeless tobacco: Never Used  Substance and Sexual Activity  . Alcohol use: No  . Drug use: No  . Sexual activity: Yes    Partners: Male    Birth control/protection: None  Lifestyle   . Physical activity:    Days per week: Not on file    Minutes per session: Not on file  . Stress: Not on file  Relationships  . Social connections:    Talks on phone: Not on file    Gets together: Not on file    Attends religious service: Not on file    Active member of club or organization: Not on file    Attends meetings of clubs or organizations: Not on file    Relationship status: Not on file  . Intimate partner violence:    Fear of current or ex partner: Not on file    Emotionally abused: Not on file    Physically abused: Not on file    Forced sexual activity: Not on file  Other Topics Concern  . Not on file  Social History Narrative  . Not on file    Family History  Problem Relation Age of Onset  . Cancer Maternal Grandmother   . Diabetes Paternal Grandmother   . Diabetes Father     The following portions of the patient's history were reviewed and updated as appropriate: allergies, current medications, past family history, past medical history, past social history, past surgical history and problem list.  Review of Systems  ROS negative except  as noted above. Information obtained from patient.   Objective:   BP 113/64   Pulse 78   Ht 5' (1.524 m)   Wt 144 lb 3 oz (65.4 kg)   LMP 01/06/2019 Comment: spotting  BMI 28.16 kg/m   CONSTITUTIONAL: Well-developed, well-nourished female in no acute distress.   Beta HCG, Quant     Status: None   Collection Time: 01/09/19 11:31 AM  Result Value Ref Range   hCG Quant <1 mIU/mL    Comment:                      Female (Non-pregnant)    0 -     5                             (Postmenopausal)  0 -     8                      Female (Pregnant)                      Weeks of Gestation                              3                6 -    71                              4               10 -   750                              5              217 -  7138                              6              158 - 31795                               7             [phone]                              8            [phone] -[phone]                              9            [phone]                             10            [phone]                             12  [phone] -[phone]                             14            13950 - 62530                             15            12039 - 70971                             16             9040 - 56451                             17             8175 - 55868                             18             8099 - [phone] [employer] E CLIA methodology     Assessment:   1. Amenorrhea  - POCT urine pregnancy - Beta HCG, Quant     Plan:   Labs: see orders, will contact patient with results  Discussed use of OCPs or Provera to management irregular cycles; patient verbalized understanding  Reviewed red flag symptoms and when to call  RTC as needed.    Gunnar Bulla, CNM [facility], [facility]    Gunnar Bulla, CNM [facility], [facility]

## 2019-02-17 ENCOUNTER — Encounter: Payer: Self-pay | Admitting: Certified Nurse Midwife

## 2019-08-06 ENCOUNTER — Other Ambulatory Visit: Payer: Self-pay | Admitting: Critical Care Medicine

## 2019-08-06 DIAGNOSIS — Z20822 Contact with and (suspected) exposure to covid-19: Secondary | ICD-10-CM

## 2019-08-08 LAB — NOVEL CORONAVIRUS, NAA: SARS-CoV-2, NAA: NOT DETECTED

## 2019-12-17 ENCOUNTER — Other Ambulatory Visit: Payer: Self-pay

## 2019-12-17 ENCOUNTER — Emergency Department: Payer: Self-pay

## 2019-12-17 ENCOUNTER — Emergency Department
Admission: EM | Admit: 2019-12-17 | Discharge: 2019-12-17 | Disposition: A | Discharge status: Home / Self Care | Payer: Self-pay | Attending: Student | Admitting: Student

## 2019-12-17 DIAGNOSIS — R1032 Left lower quadrant pain: Secondary | ICD-10-CM

## 2019-12-17 DIAGNOSIS — K529 Noninfective gastroenteritis and colitis, unspecified: Secondary | ICD-10-CM | POA: Insufficient documentation

## 2019-12-17 LAB — COMPREHENSIVE METABOLIC PANEL
ALT: 19 U/L (ref 0–44)
AST: 22 U/L (ref 15–41)
Albumin: 4.4 g/dL (ref 3.5–5.0)
Alkaline Phosphatase: 73 U/L (ref 38–126)
Anion gap: 10 (ref 5–15)
BUN: 9 mg/dL (ref 6–20)
CO2: 25 mmol/L (ref 22–32)
Calcium: 9.2 mg/dL (ref 8.9–10.3)
Chloride: 103 mmol/L (ref 98–111)
Creatinine, Ser: 0.51 mg/dL (ref 0.44–1.00)
GFR calc Af Amer: >60 mL/min (ref >60)
GFR calc non Af Amer: >60 mL/min (ref >60)
Glucose, Bld: 104 mg/dL — ABNORMAL HIGH (ref 70–99)
Potassium: 4.1 mmol/L (ref 3.5–5.1)
Sodium: 138 mmol/L (ref 135–145)
Total Bilirubin: 0.5 mg/dL (ref 0.3–1.2)
Total Protein: 8.4 g/dL — ABNORMAL HIGH (ref 6.5–8.1)

## 2019-12-17 LAB — URINALYSIS, COMPLETE (UACMP) WITH MICROSCOPIC
Bilirubin Urine: NEGATIVE
Glucose, UA: NEGATIVE mg/dL
Hgb urine dipstick: NEGATIVE
Ketones, ur: NEGATIVE mg/dL
Leukocytes,Ua: NEGATIVE
Nitrite: NEGATIVE
Protein, ur: NEGATIVE mg/dL
Specific Gravity, Urine: 1.011 (ref 1.005–1.030)
pH: 7 (ref 5.0–8.0)

## 2019-12-17 LAB — CBC
HCT: 39.3 % (ref 36.0–46.0)
Hemoglobin: 13.4 g/dL (ref 12.0–15.0)
MCH: 30.7 pg (ref 26.0–34.0)
MCHC: 34.1 g/dL (ref 30.0–36.0)
MCV: 90.1 fL (ref 80.0–100.0)
Platelets: 290 10*3/uL (ref 150–400)
RBC: 4.36 MIL/uL (ref 3.87–5.11)
RDW: 11.9 % (ref 11.5–15.5)
WBC: 12.4 10*3/uL — ABNORMAL HIGH (ref 4.0–10.5)
nRBC: 0 % (ref 0.0–0.2)

## 2019-12-17 LAB — LIPASE, BLOOD: Lipase: 21 U/L (ref 11–51)

## 2019-12-17 LAB — HCG, QUANTITATIVE, PREGNANCY: hCG, Beta Chain, Quant, S: <1 m[IU]/mL (ref <5)

## 2019-12-17 MED ORDER — IOHEXOL 300 MG/ML  SOLN
100.0000 mL | Freq: Once | INTRAMUSCULAR | Status: AC | PRN
  Administered 2019-12-17: 100 mL via INTRAVENOUS
  Filled 2019-12-17: qty 100

## 2019-12-17 MED ORDER — NAPROXEN 500 MG PO TABS: 500.0000 mg | ORAL_TABLET | Freq: Two times a day (BID) | ORAL | 0 refills | Status: AC

## 2019-12-17 MED ORDER — DICYCLOMINE HCL 10 MG PO CAPS: 10.0000 mg | ORAL_CAPSULE | Freq: Four times a day (QID) | ORAL | 0 refills | Status: AC

## 2019-12-17 MED ORDER — SODIUM CHLORIDE 0.9% FLUSH: 3.0000 mL | Freq: Once | INTRAVENOUS | Status: DC

## 2019-12-17 NOTE — ED Provider Notes (Signed)
[facility] Emergency Department Provider Note  ____________________________________________  Time seen: Approximately 3:43 PM  I have reviewed the triage vital signs and the nursing notes.   HISTORY  Chief Complaint Abdominal Pain    HPI Kaitlin Kennedy is a 24 y.o. female who presents to the emergency department for treatment and evaluation of left lower quadrant pain.  She was sent to the emergency department from [facility].  She states that the pain started 2 days ago and has worsened over the past few hours.  Pain increases with pressure to the area and movement.  Her last menstrual cycle was approximately 2 months ago.  She was advised by [facility] that her pregnancy test was negative.  She denies vaginal discharge, known exposure to STI, dysuria, flank pain, nausea, vomiting, diarrhea, fever.  No similar pain in the past.  No alleviating measures attempted prior to arrival.   Past Medical History:  Diagnosis Date  . Dysmenorrhea     Patient Active Problem List   Diagnosis Date Noted  . Abnormal uterine bleeding (AUB) 11/30/2016  . Dysmenorrhea 11/30/2016    Past Surgical History:  Procedure Laterality Date  . ABDOMINAL SURGERY     ?hernia, pt was 4y0  . WISDOM TOOTH EXTRACTION      Prior to Admission medications   Medication Sig Start Date End Date Taking? Authorizing Provider  dicyclomine (BENTYL) 10 MG capsule Take 1 capsule (10 mg total) by mouth 4 (four) times daily for 14 days. 12/17/19 12/31/19  Courtnei Ruddell, Rulon Eisenmenger B, FNP  naproxen (NAPROSYN) 500 MG tablet Take 1 tablet (500 mg total) by mouth 2 (two) times daily with a meal. 12/17/19   Tyshun Tuckerman B, FNP    Allergies Patient has no known allergies.  Family History  Problem Relation Age of Onset  . Cancer Maternal Grandmother   . Diabetes Paternal Grandmother   . Diabetes Father     Social History Social History   Tobacco Use  . Smoking status: Never Smoker  .  Smokeless tobacco: Never Used  Substance Use Topics  . Alcohol use: No  . Drug use: No    Review of Systems Constitutional: Negative for fever. Respiratory: Negative for shortness of breath or cough. Gastrointestinal: Positive for abdominal pain; negative for nausea , negative for vomiting. Genitourinary: Negative for dysuria , negative for vaginal discharge. Musculoskeletal: Negative for back pain. Skin: Negative for acute skin changes/rash/lesion. ____________________________________________   PHYSICAL EXAM:  VITAL SIGNS: ED Triage Vitals  Enc Vitals Group     BP 12/17/19 1424 114/71     Pulse Rate 12/17/19 1424 84     Resp 12/17/19 1424 18     Temp 12/17/19 1424 98.5 F (36.9 C)     Temp src --      SpO2 12/17/19 1424 100 %     Weight 12/17/19 1425 156 lb (70.8 kg)     Height 12/17/19 1425 5' (1.524 m)     Head Circumference --      Peak Flow --      Pain Score 12/17/19 1425 7     Pain Loc --      Pain Edu? --      Excl. in GC? --     Constitutional: Alert and oriented. Well appearing and in no acute distress. Eyes: Conjunctivae are normal. Head: Atraumatic. Nose: No congestion/rhinnorhea. Mouth/Throat: Mucous membranes are moist. Respiratory: Normal respiratory effort.  No retractions. Gastrointestinal: Bowel sounds active x 4; Abdomen is soft  without rebound or guarding. Genitourinary: Pelvic exam: Not indicated Musculoskeletal: No extremity tenderness nor edema.  Neurologic:  Normal speech and language. No gross focal neurologic deficits are appreciated. Speech is normal. No gait instability. Skin:  Skin is warm, dry and intact. No rash noted on exposed skin. Psychiatric: Mood and affect are normal. Speech and behavior are normal.  ____________________________________________   LABS (all labs ordered are listed, but only abnormal results are displayed)  Labs Reviewed  COMPREHENSIVE METABOLIC PANEL - Abnormal; Notable for the following components:       Result Value   Glucose, Bld 104 (*)    Total Protein 8.4 (*)    All other components within normal limits  CBC - Abnormal; Notable for the following components:   WBC 12.4 (*)    All other components within normal limits  URINALYSIS, COMPLETE (UACMP) WITH MICROSCOPIC - Abnormal; Notable for the following components:   Color, Urine STRAW (*)    APPearance CLEAR (*)    Bacteria, UA RARE (*)    All other components within normal limits  LIPASE, BLOOD  HCG, QUANTITATIVE, PREGNANCY   ____________________________________________  RADIOLOGY  CT scan shows signs of colitis in the left lower quadrant.  No diverticulosis or diverticulitis.  Normal appendix.  Mild engorgement of the left ovarian vein and parametrial vessels could be seen in the setting of pelvic congestion syndrome.  Pelvic ultrasound shows no fibroids or other masses.  Both ovaries demonstrate numerous follicles. ____________________________________________  Procedures  ____________________________________________  25 year old female presenting to the emergency department for treatment and evaluation of left lower quadrant pain.  See HPI for further details.  Plan will be to review labs, and a beta hCG onto what was collected in triage, and start with transvaginal ultrasound.  If ultrasound is negative, will consider CT.  Differential diagnosis includes but is not limited to: Topic pregnancy, IUP, diverticulitis, diverticulosis, appendicitis, intra-abdominal abscess, idiopathic abdominal pain.  Ultrasound shows no findings of concern although she does likely have PCOS.  CT scan shows some degree of colitis without specific underlying cause identified.  Exam is overall reassuring.  Pain, symptoms, and exam do not appear consistent with mesenteric ischemia.  Patient denies any recent antibiotic use, recent illness, or diarrhea.  She denies experiencing any dark or bloody stools.  Labs are overall reassuring with just a very  slight elevation of white blood cell count to 12.4.  She will initially be treated with NSAIDs and given Bentyl.  She is to call and schedule follow-up appointment with gastroenterologist.  She was also advised that she should see her gynecologist for further evaluation of potential PCOS.  For any symptom that changes or worsens, she is to see any of the above providers or return to the emergency department if unable to schedule an appointment.  Pertinent labs & imaging results that were available during my care of the patient were reviewed by me and considered in my medical decision making (see chart for details).  ____________________________________________   FINAL CLINICAL IMPRESSION(S) / ED DIAGNOSES  Final diagnoses:  Acute left lower quadrant pain  Colitis, acute    Note:  This document was prepared using Dragon voice recognition software and may include unintentional dictation errors.   Victorino Dike, FNP 12/17/19 1934    Lilia Pro., MD 12/18/19 [phone]

## 2019-12-17 NOTE — ED Triage Notes (Signed)
Sent over from MD office with c/o LLQ pain and pelvic pain for the past 2 days. MD reports pts last menstrual cycle was 2 months ago. MD reports they tested hcg in office but it was negative. MD reports pt may need [place].

## 2019-12-17 NOTE — Discharge Instructions (Addendum)
Please follow up with the gastroenterologist. Call tomorrow to request an appointment.  Take the medications as prescribed. Return to the ER for symptoms that change or worsen or for new concerns if unable to schedule an appointment with PCP or the specialist.

## 2019-12-17 NOTE — ED Triage Notes (Signed)
Pt here from [facility] with c/o LLQ abdominal pain. Pt states this started yesterday. Pt denies any N/V/D.  Pt states they sent her over for a [place].

## 2019-12-22 ENCOUNTER — Encounter: Payer: Medicaid Other | Admitting: Certified Nurse Midwife

## 2020-01-26 ENCOUNTER — Ambulatory Visit: Payer: Medicaid Other | Admitting: Gastroenterology

## 2020-03-17 ENCOUNTER — Encounter: Payer: Self-pay | Admitting: *Deleted

## 2020-03-17 ENCOUNTER — Ambulatory Visit: Payer: Medicaid Other | Admitting: Gastroenterology

## 2021-07-05 IMAGING — CT CT ABD-PELV W/ CM
2 of 4 series · 16 of 46 positions shown, 18 images · IV contrast (APPLIED)
Comparison: Pelvic sonogram of the same date

CLINICAL DATA: Left lower quadrant pain and pelvic pain for past 2
days.

EXAM:
CT ABDOMEN AND PELVIS WITH CONTRAST
TECHNIQUE: Multidetector CT imaging of the abdomen and pelvis was performed
using the standard protocol following bolus administration of
intravenous contrast.
CONTRAST:  100mL OMNIPAQUE IOHEXOL 300 MG/ML  SOLN

[Series 2: axial st · axial · 0.75mm/px · z∈[-1086,-696]mm · 13 of 86 slices shown, 15 images]
[im 4/86  soft-tissue]
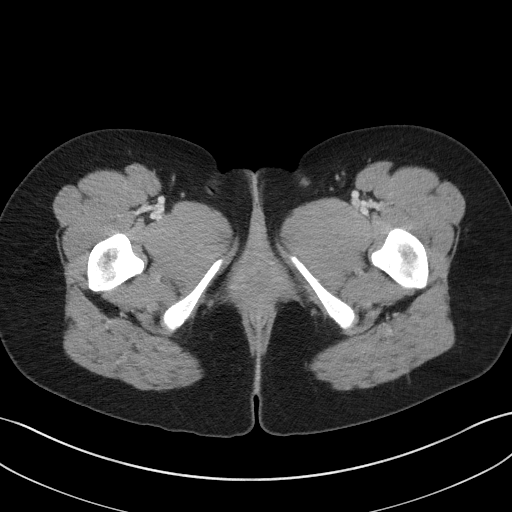
[im 4/86  bone]
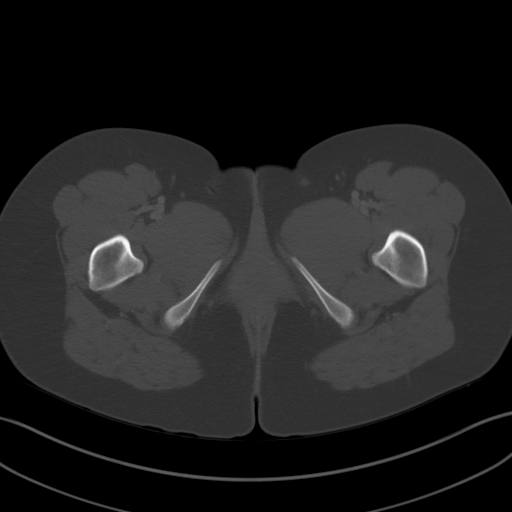
[im 11/86  soft-tissue]
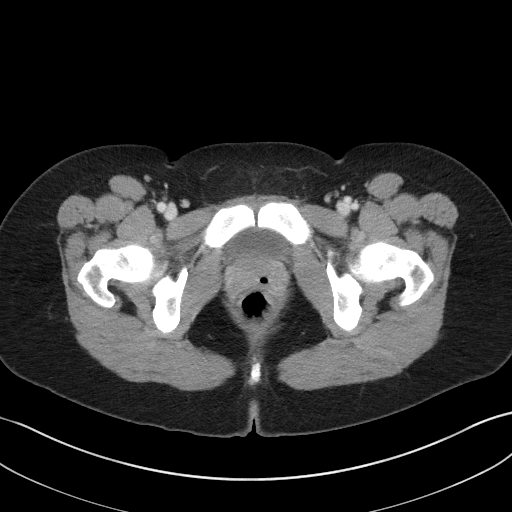
[im 18/86  soft-tissue]
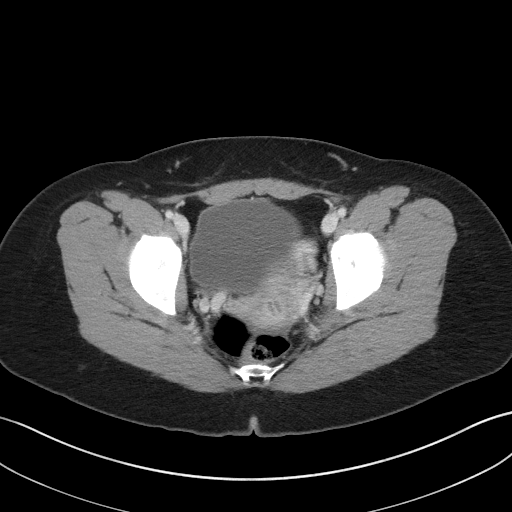
[im 24/86  soft-tissue]
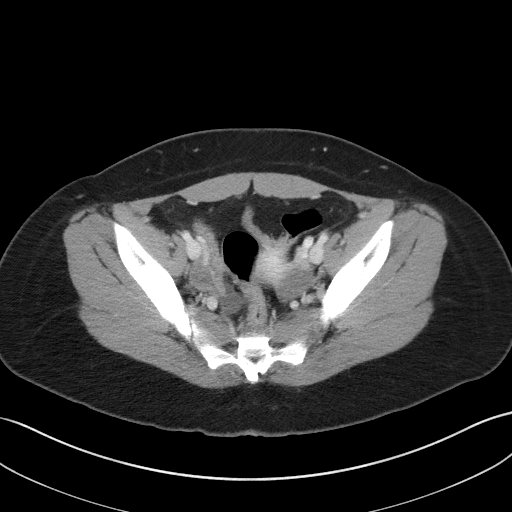
[im 31/86  soft-tissue]
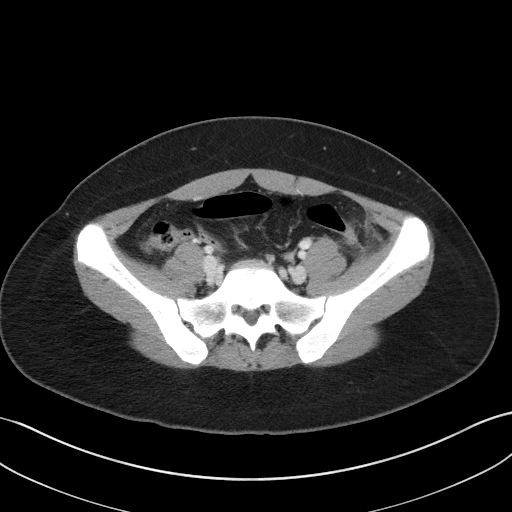
[im 38/86  soft-tissue]
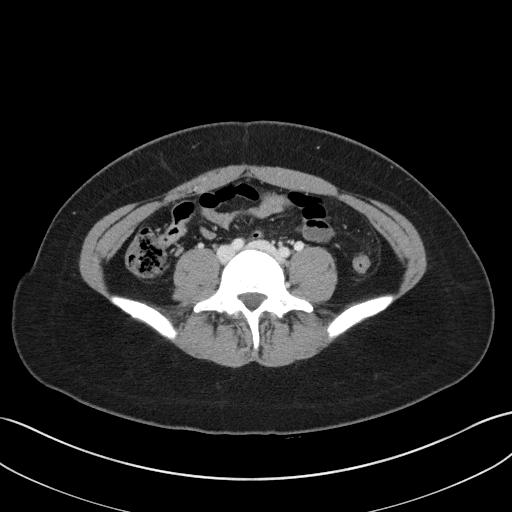
[im 45/86  soft-tissue]
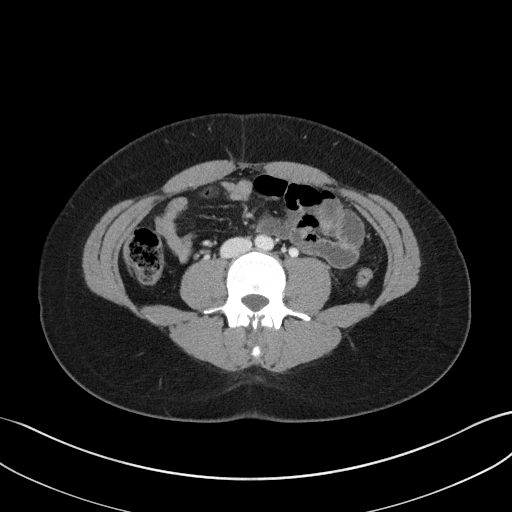
[im 48/86  soft-tissue]
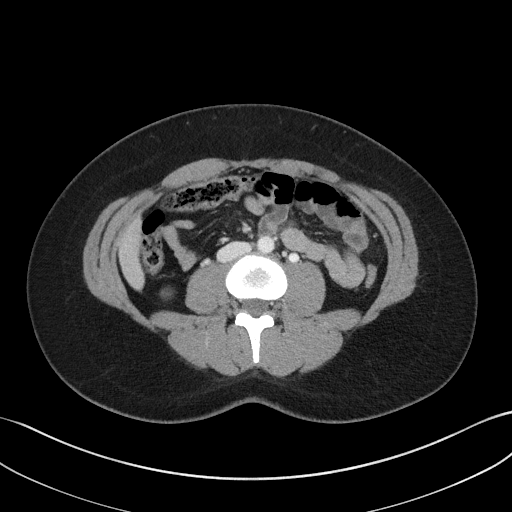
[im 55/86  soft-tissue]
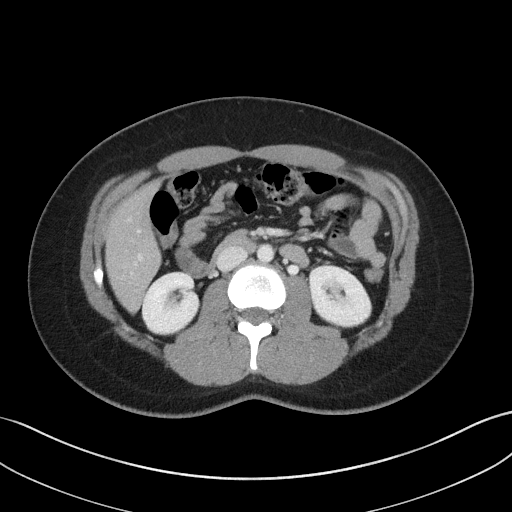
[im 55/86  bone]
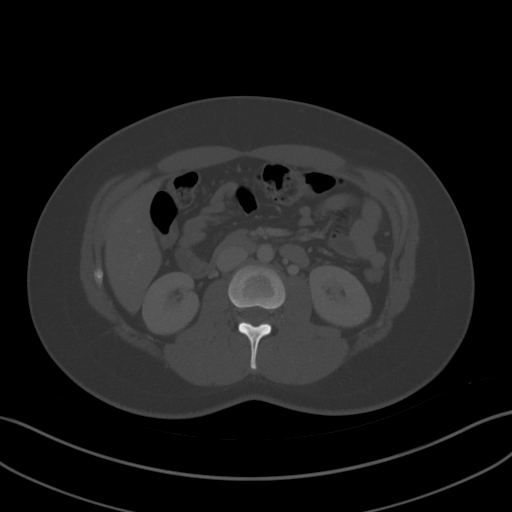
[im 62/86  soft-tissue]
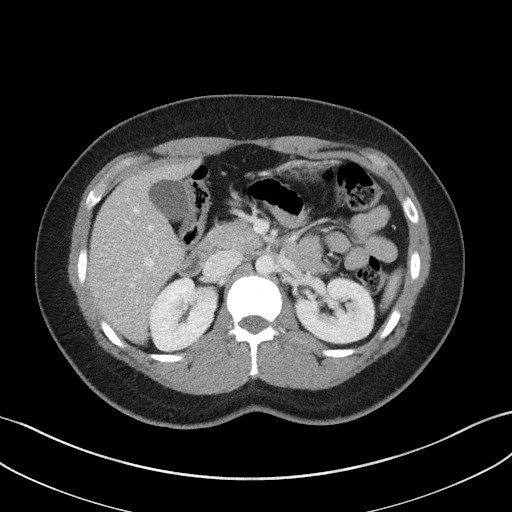
[im 69/86  soft-tissue]
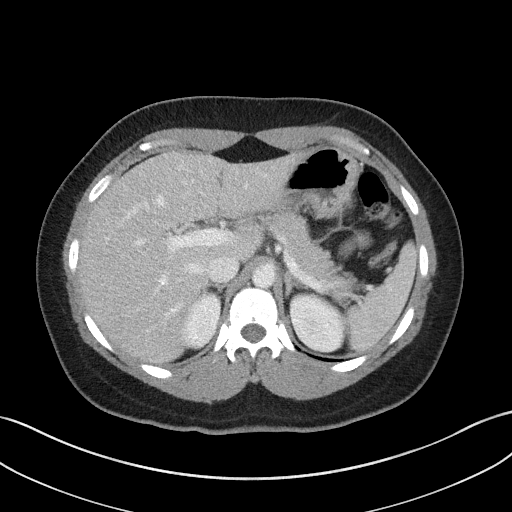
[im 75/86  soft-tissue]
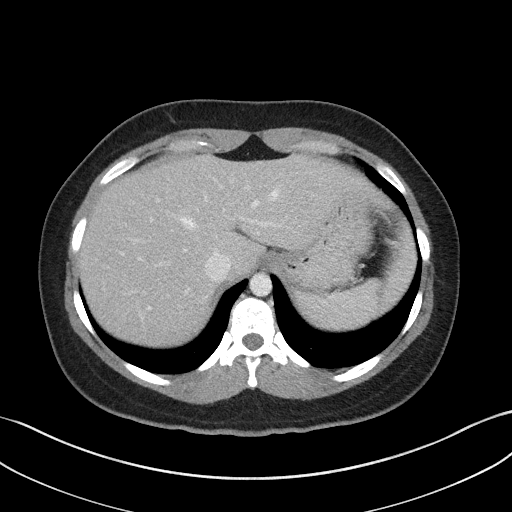
[im 82/86  soft-tissue]
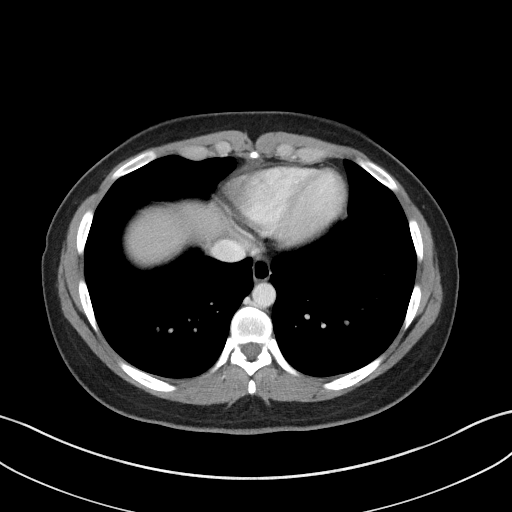

[Series 5: coronal st · coronal · 0.80mm/px · 3 of 79 slices shown]
[im 27/79  soft-tissue]
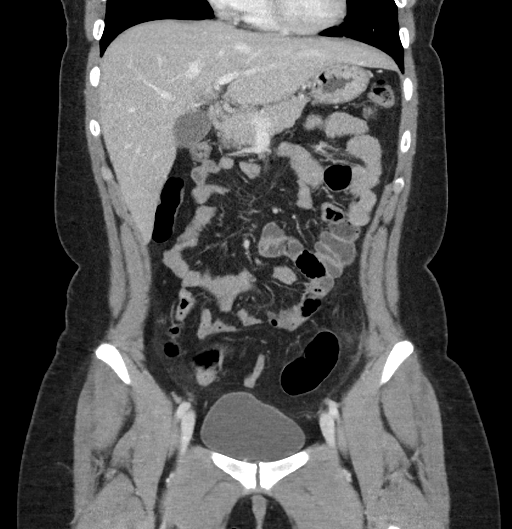
[im 35/79  soft-tissue]
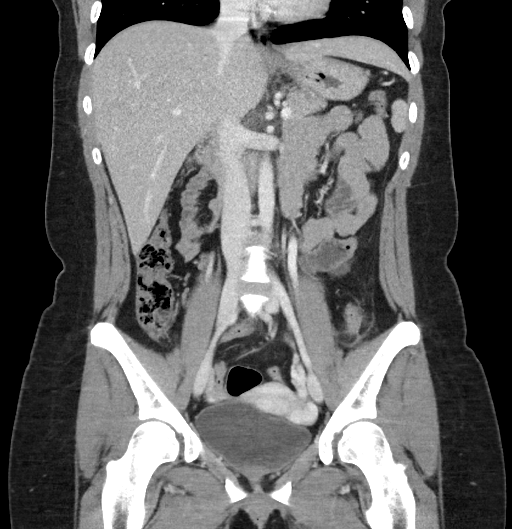
[im 44/79  soft-tissue]
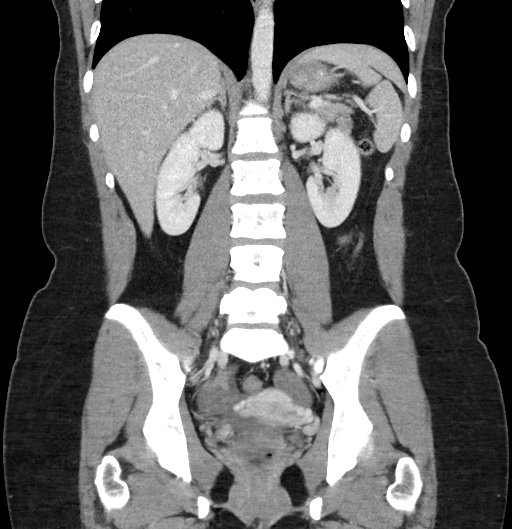

[16 of 46 positions shown; findings below may reference images not displayed]

FINDINGS: Lower chest: No signs of effusion or consolidation.

Hepatobiliary: No focal liver abnormality is seen. No gallstones,
gallbladder wall thickening, or biliary dilatation.

Pancreas: Pancreas is normal. No signs of lesion ductal dilation or
inflammation.

Spleen: Spleen is normal size without focal lesion.

Adrenals/Urinary Tract: Adrenal glands are normal. No signs of
hydronephrosis. Symmetric renal enhancement. Urinary bladder is
normal.

Stomach/Bowel: Stomach is normal. Small bowel without dilation or
perienteric stranding. Colon is stool filled throughout the proximal
portion.

Stranding about the colon in the left lower quadrant. No signs of
colonic pneumatosis. No signs of diverticulosis. No free air.

Appendix is normal.

Vascular/Lymphatic: Patent abdominal vasculature. No signs of
adenopathy. Mild engorgement of left ovarian vein and parametrial
vessels.

No signs of pelvic lymphadenopathy.

Reproductive: Nonspecific appearance of uterus on CT and ovaries
better assessed on pelvic sonogram, see dedicated report.

Engorgement of left ovarian vein could be seen in setting of pelvic
congestion syndrome but should be correlated clinically.

Other: Small free fluid in the pelvis may be physiologic.

Musculoskeletal: No signs of acute bone finding or evidence of
destructive bone process.
IMPRESSION: 1. Signs of colitis in the left lower quadrant of uncertain etiology
given lack of visible diverticular disease. Correlate with recent
antibiotic administration, signs of any diarrhea and with lactate.
2. Normal appendix.
3. Mild engorgement of left ovarian vein and parametrial vessels
could be seen in setting of pelvic congestion syndrome but should be
correlated clinically.
4. Small free fluid in the pelvis may be physiologic.
